# Patient Record
Sex: Male | Born: 1965 | Race: White | Hispanic: No | Marital: Single | Smoking: Current every day smoker
Health system: Southern US, Community
[De-identification: ages and names within clinical notes are randomized; demographics above are authoritative.]

## PROBLEM LIST (undated history)

## (undated) DIAGNOSIS — E119 Type 2 diabetes mellitus without complications: Secondary | ICD-10-CM

## (undated) DIAGNOSIS — M549 Dorsalgia, unspecified: Secondary | ICD-10-CM

## (undated) DIAGNOSIS — E785 Hyperlipidemia, unspecified: Secondary | ICD-10-CM

## (undated) HISTORY — PX: BLADDER SURGERY: SHX569

---

## 2015-06-06 ENCOUNTER — Ambulatory Visit (INDEPENDENT_AMBULATORY_CARE_PROVIDER_SITE_OTHER): Payer: BC Managed Care – PPO

## 2015-06-06 ENCOUNTER — Ambulatory Visit
Admission: EM | Admit: 2015-06-06 | Discharge: 2015-06-06 | Disposition: A | Discharge status: Home / Self Care | Payer: BC Managed Care – PPO | Attending: Family Medicine | Admitting: Family Medicine

## 2015-06-06 ENCOUNTER — Encounter: Payer: Self-pay | Admitting: Gynecology

## 2015-06-06 DIAGNOSIS — M5441 Lumbago with sciatica, right side: Secondary | ICD-10-CM

## 2015-06-06 DIAGNOSIS — M5430 Sciatica, unspecified side: Secondary | ICD-10-CM | POA: Diagnosis not present

## 2015-06-06 HISTORY — DX: Dorsalgia, unspecified: M54.9

## 2015-06-06 HISTORY — DX: Hyperlipidemia, unspecified: E78.5

## 2015-06-06 HISTORY — DX: Type 2 diabetes mellitus without complications: E11.9

## 2015-06-06 MED ORDER — PREDNISONE 10 MG (21) PO TBPK: ORAL_TABLET | ORAL | Status: AC

## 2015-06-06 MED ORDER — KETOROLAC TROMETHAMINE 60 MG/2ML IM SOLN
60.0000 mg | Freq: Once | INTRAMUSCULAR | Status: AC
  Administered 2015-06-06: 60 mg via INTRAMUSCULAR

## 2015-06-06 MED ORDER — HYDROCODONE-ACETAMINOPHEN 5-325 MG PO TABS: 1.0000 | ORAL_TABLET | Freq: Three times a day (TID) | ORAL | Status: DC | PRN

## 2015-06-06 MED ORDER — OXYCODONE HCL 5 MG PO TABS: 5.0000 mg | ORAL_TABLET | Freq: Three times a day (TID) | ORAL | Status: AC | PRN

## 2015-06-06 MED ORDER — MELOXICAM 15 MG PO TABS: 15.0000 mg | ORAL_TABLET | Freq: Every day | ORAL | Status: AC

## 2015-06-06 MED ORDER — ORPHENADRINE CITRATE ER 100 MG PO TB12: 100.0000 mg | ORAL_TABLET | Freq: Two times a day (BID) | ORAL | Status: AC

## 2015-06-06 NOTE — ED Notes (Signed)
Patient c/o lower back pain x 2 days. 

## 2015-06-06 NOTE — ED Provider Notes (Signed)
CSN: [id number]     Arrival date & time 06/06/15  1013 History   First MD Initiated Contact with Patient 06/06/15 1220    Nurses notes were reviewed. Chief Complaint  Patient presents with  . Back Pain  Patient's here because of back pain he states the back pain started about Thursday night. 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 He states pains going down the right middle thigh on the lateral side and down his leg. States he has a colitis about 2 years ago he did not get follow-up do not have x-rays or further evaluation since everything got better. He denies any trauma any new medication. He is a diabetic. He has hyperlipidemia. He has had bladder surgery before.  Unfortunately smokes and no significant pertinent family medical history to this visit.   (Consider location/radiation/quality/duration/timing/severity/associated sxs/prior Treatment) Patient is a 50 y.o. male presenting with back pain. The history is provided by the patient. No language interpreter was used.  Back Pain Location:  Thoracic spine Radiates to:  R posterior upper leg Onset quality:  Sudden Progression:  Worsening Context: not MCA, not physical stress, not recent illness and not recent injury   Relieved by:  Nothing Worsened by:  Movement Ineffective treatments:  Ibuprofen and OTC medications Associated symptoms: leg pain, numbness and weakness     Past Medical History  Diagnosis Date  . Diabetes mellitus without complication (HCC)   . Hyperlipemia   . Acute back pain    Past Surgical History  Procedure Laterality Date  . Bladder surgery     No family history on file. Social History  Substance Use Topics  . Smoking status: Current Every Day Smoker -- 0.50 packs/day  . Smokeless tobacco: None  . Alcohol Use: No    Review of Systems  Musculoskeletal: Positive for back pain.  Neurological: Positive for weakness and numbness.  All other  systems reviewed and are negative.   Allergies  Tylenol  Home Medications   Prior to Admission medications   Medication Sig Start Date End Date Taking? Authorizing Provider  aspirin 81 MG tablet Take 81 mg by mouth daily.   Yes Historical Provider, MD  insulin detemir (LEVEMIR) 100 UNIT/ML injection Inject into the skin at bedtime.   Yes Historical Provider, MD  rosuvastatin (CRESTOR) 10 MG tablet Take 10 mg by mouth daily.   Yes Historical Provider, MD  sitaGLIPtin-metformin (JANUMET) 50-1000 MG tablet Take 1 tablet by mouth 2 (two) times daily with a meal.   Yes Historical Provider, MD  meloxicam (MOBIC) 15 MG tablet Take 1 tablet (15 mg total) by mouth daily. 06/06/15   Hassan Rowan, MD  orphenadrine (NORFLEX) 100 MG tablet Take 1 tablet (100 mg total) by mouth 2 (two) times daily. 06/06/15   Hassan Rowan, MD  oxyCODONE (ROXICODONE) 5 MG immediate release tablet Take 1 tablet (5 mg total) by mouth 3 (three) times daily as needed for severe pain. 06/06/15   Hassan Rowan, MD  predniSONE (STERAPRED UNI-PAK 21 TAB) 10 MG (21) TBPK tablet Sig 6 tablet day 1, 5 tablets day 2, 4 tablets day 3,,3tablets day 4, 2 tablets day 5, 1 tablet day 6 take all tablets orally 06/06/15   Hassan Rowan, MD   Meds Ordered and Administered this Visit   Medications  ketorolac (TORADOL) injection 60 mg (60 mg Intramuscular Given 06/06/15 1256)    BP 118/75 mmHg  Pulse 81  Temp(Src) 98.2 F (36.8 C) (Oral)  Ht 5\' 9"  (1.753 m)  Wt 230 lb (104.327 kg)  BMI 33.95 kg/m2  SpO2 99% No data found.   Physical Exam  Constitutional: He appears well-developed and well-nourished.  HENT:  Head: Normocephalic and atraumatic.  Eyes: Pupils are equal, round, and reactive to light.  Musculoskeletal: He exhibits tenderness.       Lumbar back: He exhibits tenderness and bony tenderness.       Back:       Legs: Only mild tenderness over the left lumbar area left iliac sacral area as well  Neurological: He is alert.  Skin:  Skin is warm and dry.  Psychiatric: He has a normal mood and affect.  Vitals reviewed.   ED Course  Procedures (including critical care time)  Labs Review Labs Reviewed - No data to display  Imaging Review Dg Lumbar Spine Complete  06/06/2015  CLINICAL DATA:  Back pain for 2 days. No known injury. Pain travels down right leg. EXAM: LUMBAR SPINE - COMPLETE 4+ VIEW COMPARISON:  None. FINDINGS: There is no evidence of lumbar spine fracture. Alignment is normal. Intervertebral disc spaces are maintained. IMPRESSION: Negative. Electronically Signed   By: Charlett NoseKevin  Dover M.D.   On: 06/06/2015 13:03     Visual Acuity Review  Right Eye Distance:   Left Eye Distance:   Bilateral Distance:    Right Eye Near:   Left Eye Near:    Bilateral Near:         MDM   1. Sciatic leg pain   2. Right-sided low back pain with right-sided sciatica    Patient was given 60 Toradol IM he reports marked improvement with the Toradol. He is able to walk now without pain and discomfort he had before. We'll place on Roxicet 5 mg 1 tablet every 8 hours when necessary #12 recommend he mainly just use this at night to sleep. Mobic 15 mg 1 tablet a day #30 Norflex muscle laxative 1 tablet twice a day and will place him on 6 day course of prednisone as well. Follow up with PCP in  about a week and work note for today and tomorrow.   Note: This dictation was prepared with Dragon dictation along with smaller phrase technology. Any transcriptional errors that result from this process are unintentional.    Hassan RowanEugene Jayleen Scaglione, MD 06/06/15 1407

## 2015-06-06 NOTE — Discharge Instructions (Signed)
Sciatica Sciatica is pain, weakness, numbness, or tingling along your sciatic nerve. The nerve starts in the lower back and runs down the back of each leg. Nerve damage or certain conditions pinch or put pressure on the sciatic nerve. This causes the pain, weakness, and other discomforts of sciatica. HOME CARE  1. Only take medicine as told by your doctor. 2. Apply ice to the affected area for 20 minutes. Do this 3-4 times a day for the first 48-72 hours. Then try heat in the same way. 3. Exercise, stretch, or do your usual activities if these do not make your pain worse. 4. Go to physical therapy as told by your doctor. 5. Keep all doctor visits as told. 6. Do not wear high heels or shoes that are not supportive. 7. Get a firm mattress if your mattress is too soft to lessen pain and discomfort. GET HELP RIGHT AWAY IF:  1. You cannot control when you poop (bowel movement) or pee (urinate). 2. You have more weakness in your lower back, lower belly (pelvis), butt (buttocks), or legs. 3. You have redness or puffiness (swelling) of your back. 4. You have a burning feeling when you pee. 5. You have pain that gets worse when you lie down. 6. You have pain that wakes you from your sleep. 7. Your pain is worse than past pain. 8. Your pain lasts longer than 4 weeks. 9. You are suddenly losing weight without reason. MAKE SURE YOU:  1. Understand these instructions. 2. Will watch this condition. 3. Will get help right away if you are not doing well or get worse.   This information is not intended to replace advice given to you by your health care provider. Make sure you discuss any questions you have with your health care provider.   Document Released: 09/29/2007 Document Revised: 09/10/2014 Document Reviewed: 05/01/2011 Elsevier Interactive Patient Education 2016 [employer]  Back Exercises If you have pain in your back, do these exercises 2-3 times each day or as told by your doctor. When  the pain goes away, do the exercises once each day, but repeat the steps more times for each exercise (do more repetitions). If you do not have pain in your back, do these exercises once each day or as told by your doctor. EXERCISES Single Knee to Chest Do these steps 3-5 times in a row for each leg: 8. Lie on your back on a firm bed or the floor with your legs stretched out. 9. Bring one knee to your chest. 10. Hold your knee to your chest by grabbing your knee or thigh. 11. Pull on your knee until you feel a gentle stretch in your lower back. 12. Keep doing the stretch for 10-30 seconds. 13. Slowly let go of your leg and straighten it. Pelvic Tilt Do these steps 5-10 times in a row: 10. Lie on your back on a firm bed or the floor with your legs stretched out. 11. Bend your knees so they point up to the ceiling. Your feet should be flat on the floor. 12. Tighten your lower belly (abdomen) muscles to press your lower back against the floor. This will make your tailbone point up to the ceiling instead of pointing down to your feet or the floor. 13. Stay in this position for 5-10 seconds while you gently tighten your muscles and breathe evenly. Cat-Cow Do these steps until your lower back bends more easily: 4. Get on your hands and knees on a firm surface.  Keep your hands under your shoulders, and keep your knees under your hips. You may put padding under your knees. 5. Let your head hang down, and make your tailbone point down to the floor so your lower back is round like the back of a cat. 6. Stay in this position for 5 seconds. 7. Slowly lift your head and make your tailbone point up to the ceiling so your back hangs low (sags) like the back of a cow. 8. Stay in this position for 5 seconds. Press-Ups Do these steps 5-10 times in a row: 1. Lie on your belly (face-down) on the floor. 2. Place your hands near your head, about shoulder-width apart. 3. While you keep your back relaxed and keep  your hips on the floor, slowly straighten your arms to raise the top half of your body and lift your shoulders. Do not use your back muscles. To make yourself more comfortable, you may change where you place your hands. 4. Stay in this position for 5 seconds. 5. Slowly return to lying flat on the floor. Bridges Do these steps 10 times in a row: 1. Lie on your back on a firm surface. 2. Bend your knees so they point up to the ceiling. Your feet should be flat on the floor. 3. Tighten your butt muscles and lift your butt off of the floor until your waist is almost as high as your knees. If you do not feel the muscles working in your butt and the back of your thighs, slide your feet 1-2 inches farther away from your butt. 4. Stay in this position for 3-5 seconds. 5. Slowly lower your butt to the floor, and let your butt muscles relax. If this exercise is too easy, try doing it with your arms crossed over your chest. Belly Crunches Do these steps 5-10 times in a row: 1. Lie on your back on a firm bed or the floor with your legs stretched out. 2. Bend your knees so they point up to the ceiling. Your feet should be flat on the floor. 3. Cross your arms over your chest. 4. Tip your chin a little bit toward your chest but do not bend your neck. 5. Tighten your belly muscles and slowly raise your chest just enough to lift your shoulder blades a tiny bit off of the floor. 6. Slowly lower your chest and your head to the floor. Back Lifts Do these steps 5-10 times in a row: 1. Lie on your belly (face-down) with your arms at your sides, and rest your forehead on the floor. 2. Tighten the muscles in your legs and your butt. 3. Slowly lift your chest off of the floor while you keep your hips on the floor. Keep the back of your head in line with the curve in your back. Look at the floor while you do this. 4. Stay in this position for 3-5 seconds. 5. Slowly lower your chest and your face to the floor. GET  HELP IF:  Your back pain gets a lot worse when you do an exercise.  Your back pain does not lessen 2 hours after you exercise. If you have any of these problems, stop doing the exercises. Do not do them again unless your doctor says it is okay. GET HELP RIGHT AWAY IF:  You have sudden, very bad back pain. If this happens, stop doing the exercises. Do not do them again unless your doctor says it is okay.   This information is not  intended to replace advice given to you by your health care provider. Make sure you discuss any questions you have with your health care provider.   Document Released: 01/22/2010 Document Revised: 09/10/2014 Document Reviewed: 02/13/2014 Elsevier Interactive Patient Education [employer]

## 2017-05-27 IMAGING — CR DG LUMBAR SPINE COMPLETE 4+V
5 series · 5 of 5 positions shown · non-contrast
Comparison: None.

CLINICAL DATA: Back pain for 2 days. No known injury. Pain travels
down right leg.

EXAM:
LUMBAR SPINE - COMPLETE 4+ VIEW

[l-spine ap]
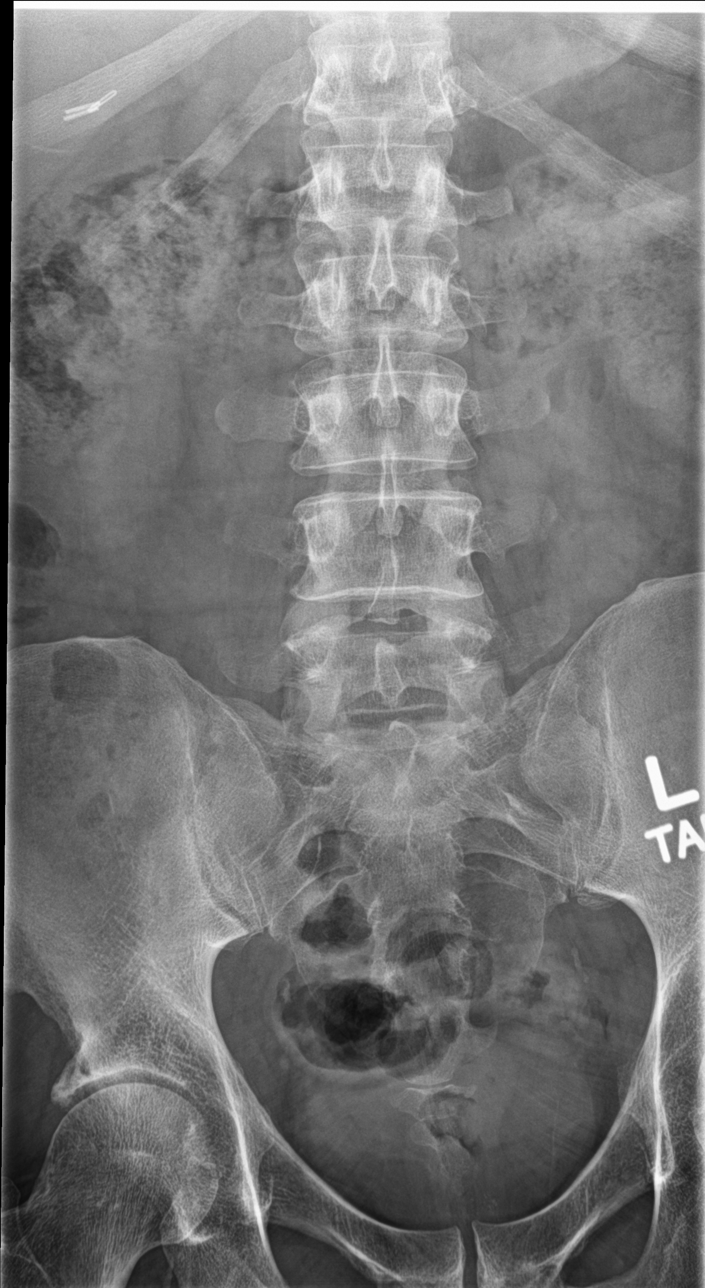

[l-spine obl (1 of 2)]
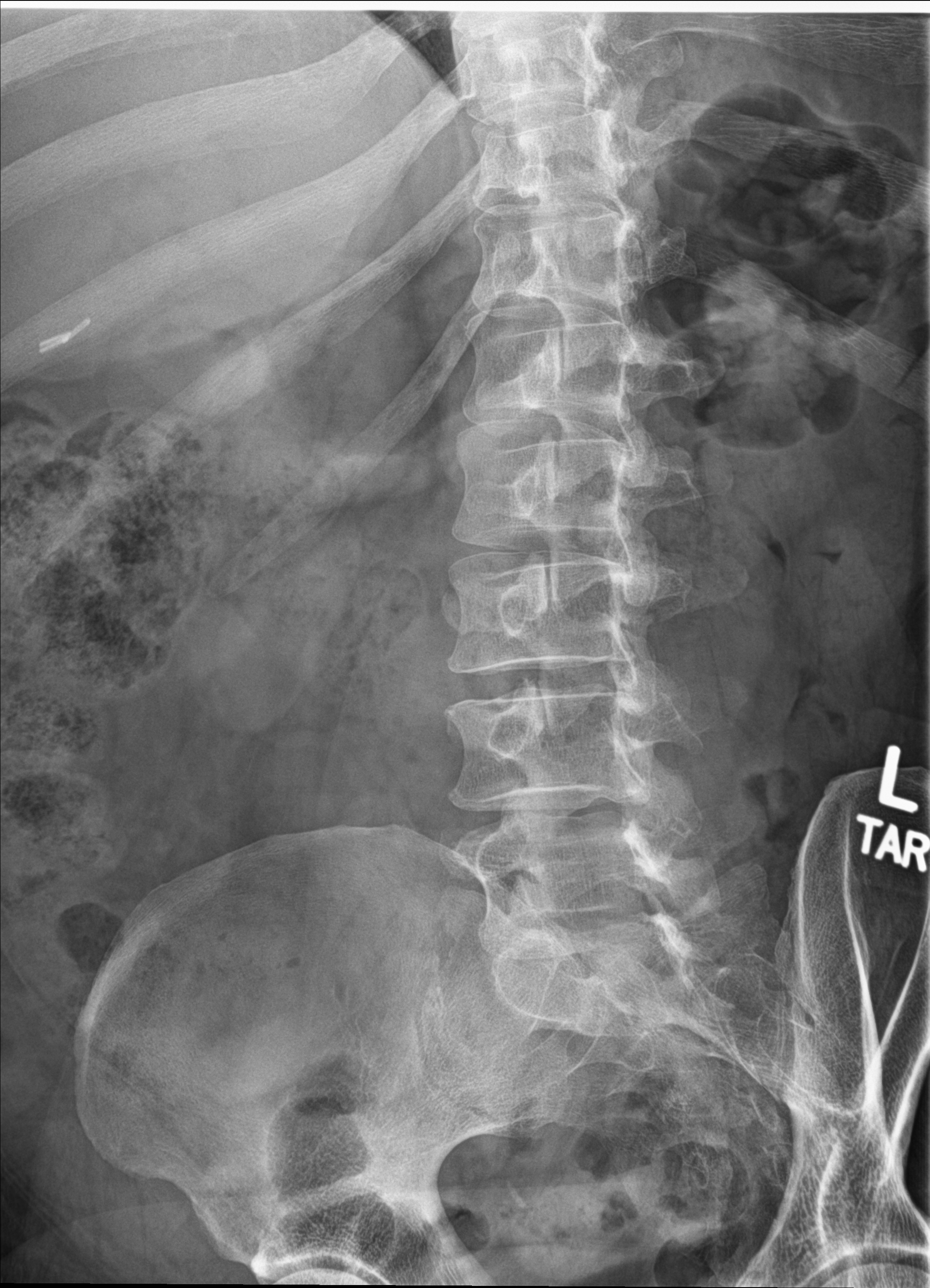

[l-spine obl (2 of 2)]
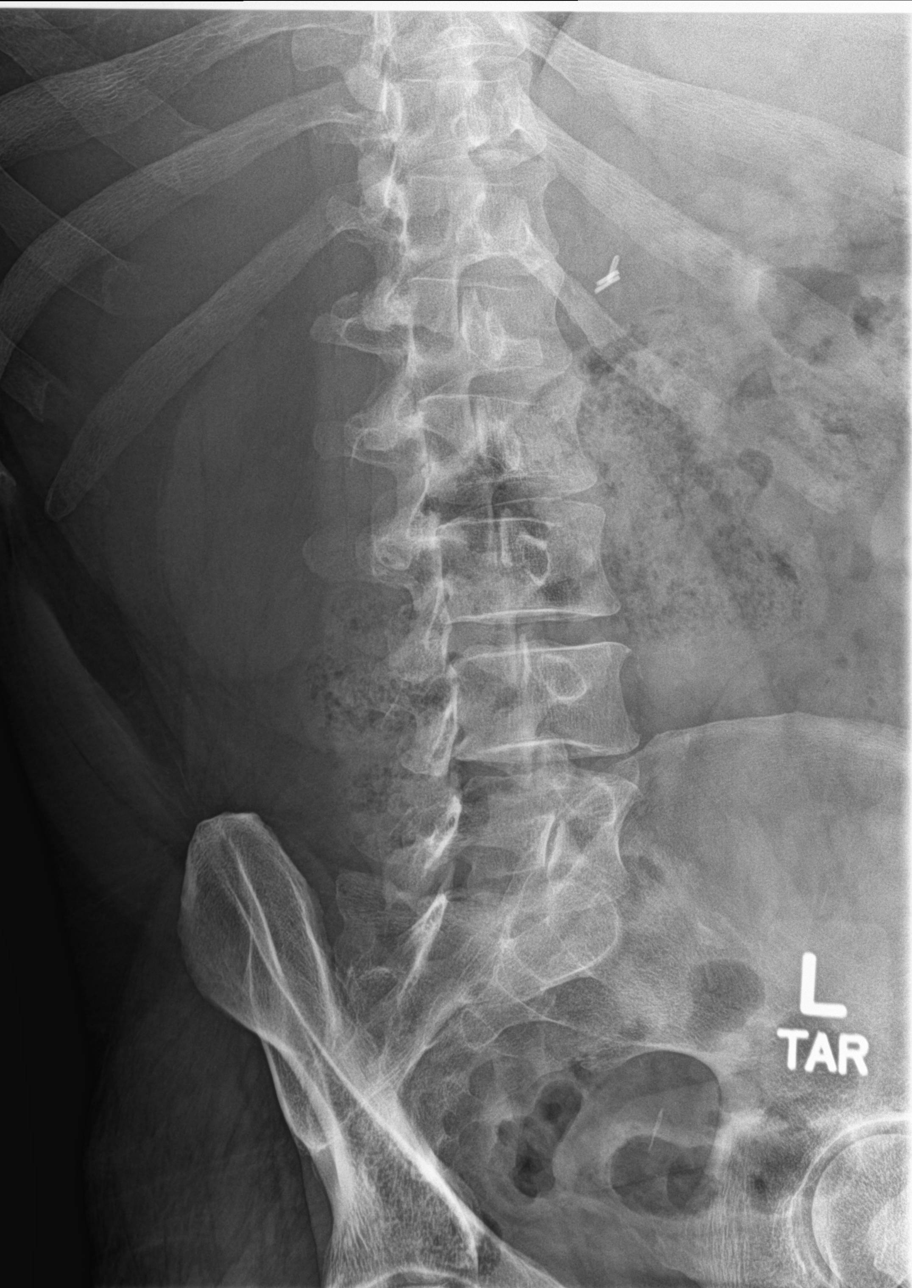

[l-spine lat]
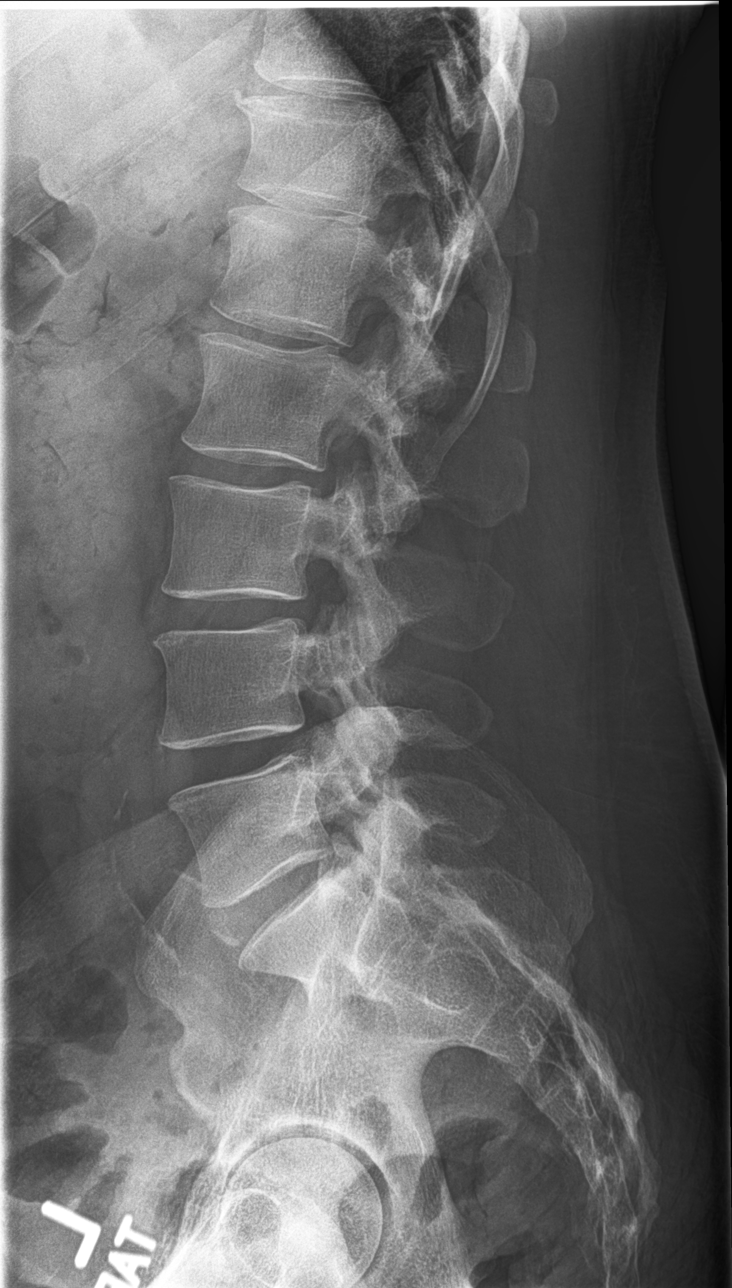

[l-spine spot]
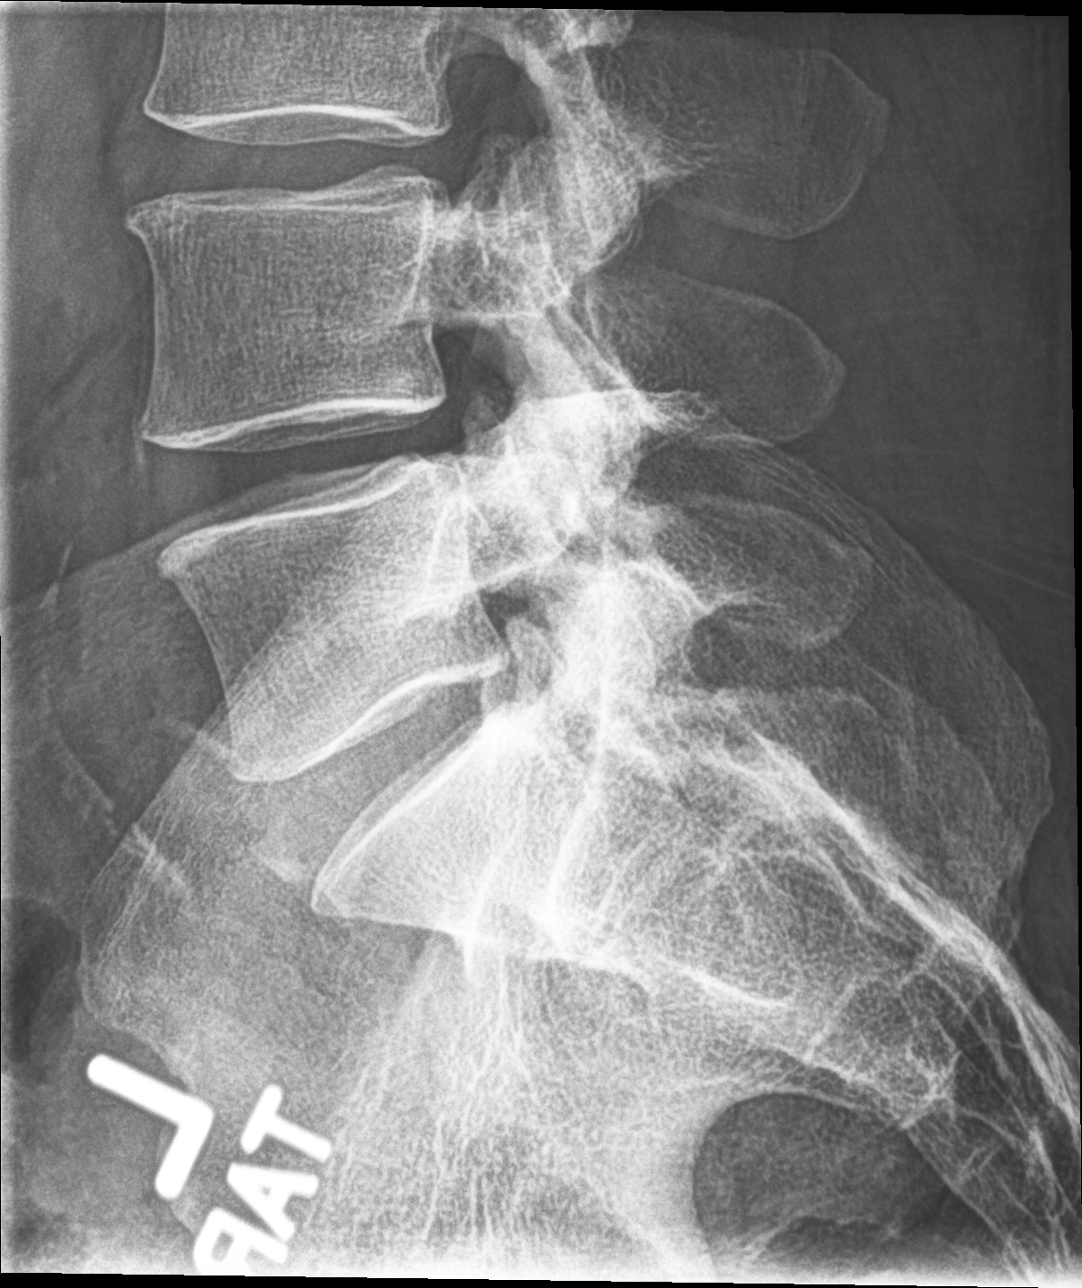

[5 of 5 positions shown; findings below may reference images not displayed]

FINDINGS: There is no evidence of lumbar spine fracture. Alignment is normal.
Intervertebral disc spaces are maintained.
IMPRESSION: Negative.
